# Patient Record
Sex: Male | Born: 1970 | Smoking: Never smoker
Health system: Southern US, Community
[De-identification: ages and names within clinical notes are randomized; demographics above are authoritative.]

## PROBLEM LIST (undated history)

## (undated) DIAGNOSIS — G629 Polyneuropathy, unspecified: Secondary | ICD-10-CM

## (undated) HISTORY — PX: FRACTURE SURGERY: SHX138

## (undated) HISTORY — DX: Polyneuropathy, unspecified: G62.9

## (undated) HISTORY — PX: COSMETIC SURGERY: SHX468

---

## 2013-08-19 ENCOUNTER — Ambulatory Visit (INDEPENDENT_AMBULATORY_CARE_PROVIDER_SITE_OTHER): Payer: Self-pay | Admitting: Family Medicine

## 2013-08-19 ENCOUNTER — Encounter (INDEPENDENT_AMBULATORY_CARE_PROVIDER_SITE_OTHER): Payer: Self-pay

## 2013-08-19 VITALS — BP 150/108 | HR 90 | Temp 98.3°F | Resp 18 | Ht 69.0 in | Wt 210.0 lb

## 2013-08-19 DIAGNOSIS — G589 Mononeuropathy, unspecified: Secondary | ICD-10-CM

## 2013-08-19 DIAGNOSIS — L03039 Cellulitis of unspecified toe: Secondary | ICD-10-CM

## 2013-08-19 DIAGNOSIS — G629 Polyneuropathy, unspecified: Secondary | ICD-10-CM

## 2013-08-19 DIAGNOSIS — L03031 Cellulitis of right toe: Secondary | ICD-10-CM

## 2013-08-19 DIAGNOSIS — Z9889 Other specified postprocedural states: Secondary | ICD-10-CM

## 2013-08-19 MED ORDER — HYDROCODONE-ACETAMINOPHEN 10-325 MG PO TABS
1.0000 | ORAL_TABLET | Freq: Three times a day (TID) | ORAL | Status: AC | PRN
Start: 2013-08-19 — End: 2013-09-18

## 2013-08-19 MED ORDER — CEPHALEXIN 500 MG PO CAPS
500.0000 mg | ORAL_CAPSULE | Freq: Four times a day (QID) | ORAL | Status: AC
Start: 2013-08-19 — End: 2013-08-29

## 2013-08-19 MED ORDER — GABAPENTIN 100 MG PO CAPS
100.0000 mg | ORAL_CAPSULE | Freq: Three times a day (TID) | ORAL | Status: AC
Start: 2013-08-19 — End: 2013-09-18

## 2013-08-19 MED ORDER — HYDROCODONE-ACETAMINOPHEN 5-325 MG PO TABS
1.0000 | ORAL_TABLET | Freq: Three times a day (TID) | ORAL | Status: DC
Start: 2013-08-19 — End: 2013-08-19
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
## 2013-08-19 NOTE — Patient Instructions (Signed)
Paronychia, Finger Or Toe  Paronychia is an infection alongside the fingernail or toenail. It usually occurs from an opening in the cuticle or an ingrown toenail which lets bacteria under the skin.  If there is pus present, the infection will need to be drained. If the infection is early, antibiotic treatment alone may be all that you need. Healing will take about 1-2 weeks.  Home care  The following guidelines will help you care for your wound at home:   Twice a Votta for the first three days, clean and soak the toe or finger as follows:   Soak your foot or hand in a tub of warm water for five minutes. Or, hold your toe or finger under a faucet of warm running water for five minutes.   Clean any remaining crust away with soap and water using a cotton-tipped applicator.   Apply antibiotic ointment to the infected area.   Change the dressing daily or whenever it becomes soiled.   If you were prescribed antibiotics, take them as directed until they are all gone.   If your infection is on a toe, wear comfortable shoes with a lot of toe room, or open-toe sandals, while your toe is healing.   You may use acetaminophen or ibuprofen to control pain, unless another medicine was prescribed.If you have chronic liver or kidney disease or ever had a stomach ulcer or GI bleeding, talk with your doctor before using these medicines.  Follow-up care  Follow up with your doctor or this facility as explained by our staff.  When to seek medical care  Get prompt medical attention if any of the following occur:   Increasing redness, pain or swelling of the finger or toe   Red streaks in the skin leading away from the wound   Pus or fluid drainage   Fever of 100.4F (38C) or higher, or as directed by your health care provider   2000-2014 [employer], [address], [place] [address]. All rights reserved. This information is not intended as a substitute for professional medical care. Always follow your  healthcare professional's instructions.
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
